# Patient Record
Sex: Male | Born: 1950 | Race: White | Hispanic: No | Marital: Married | Smoking: Former smoker
Health system: Southern US, Community
[De-identification: ages and names within clinical notes are randomized; demographics above are authoritative.]

## PROBLEM LIST (undated history)

## (undated) DIAGNOSIS — I10 Essential (primary) hypertension: Secondary | ICD-10-CM

## (undated) DIAGNOSIS — E213 Hyperparathyroidism, unspecified: Secondary | ICD-10-CM

## (undated) DIAGNOSIS — G709 Myoneural disorder, unspecified: Secondary | ICD-10-CM

## (undated) DIAGNOSIS — E785 Hyperlipidemia, unspecified: Secondary | ICD-10-CM

## (undated) HISTORY — PX: PARATHYROIDECTOMY: SHX19

## (undated) HISTORY — PX: INGUINAL HERNIA REPAIR: SUR1180

---

## 2010-12-24 ENCOUNTER — Ambulatory Visit: Payer: Self-pay | Admitting: Gastroenterology

## 2010-12-28 LAB — PATHOLOGY REPORT

## 2012-11-27 ENCOUNTER — Ambulatory Visit: Payer: Self-pay | Admitting: Surgery

## 2012-11-27 LAB — BASIC METABOLIC PANEL
Anion Gap: 4 — ABNORMAL LOW (ref 7–16)
BUN: 17 mg/dL (ref 7–18)
Creatinine: 1.02 mg/dL (ref 0.60–1.30)
EGFR (African American): >60
EGFR (Non-African Amer.): >60
Glucose: 92 mg/dL (ref 65–99)
Osmolality: 277 (ref 275–301)

## 2012-11-27 LAB — CBC
MCV: 88 fL (ref 80–100)
Platelet: 322 10*3/uL (ref 150–440)
RBC: 5.59 10*6/uL (ref 4.40–5.90)
WBC: 9.1 10*3/uL (ref 3.8–10.6)

## 2012-12-03 ENCOUNTER — Ambulatory Visit: Payer: Self-pay | Admitting: Surgery

## 2014-01-07 ENCOUNTER — Ambulatory Visit: Payer: Self-pay | Admitting: Physician Assistant

## 2014-01-07 LAB — CBC WITH DIFFERENTIAL/PLATELET
BASOS ABS: 0 10*3/uL (ref 0.0–0.1)
BASOS PCT: 0.3 %
Eosinophil #: 0 10*3/uL (ref 0.0–0.7)
Eosinophil %: 0.2 %
HCT: 53.3 % — ABNORMAL HIGH (ref 40.0–52.0)
HGB: 17.6 g/dL (ref 13.0–18.0)
LYMPHS ABS: 0.8 10*3/uL — AB (ref 1.0–3.6)
LYMPHS PCT: 6.7 %
MCH: 29.2 pg (ref 26.0–34.0)
MCHC: 33 g/dL (ref 32.0–36.0)
MCV: 89 fL (ref 80–100)
MONO ABS: 0.5 x10 3/mm (ref 0.2–1.0)
Monocyte %: 3.6 %
Neutrophil #: 11.3 10*3/uL — ABNORMAL HIGH (ref 1.4–6.5)
Neutrophil %: 89.2 %
PLATELETS: 301 10*3/uL (ref 150–440)
RBC: 6.02 10*6/uL — ABNORMAL HIGH (ref 4.40–5.90)
RDW: 13.8 % (ref 11.5–14.5)
WBC: 12.7 10*3/uL — AB (ref 3.8–10.6)

## 2014-01-07 LAB — COMPREHENSIVE METABOLIC PANEL
ALK PHOS: 70 U/L
ANION GAP: 10 (ref 7–16)
Albumin: 4.3 g/dL (ref 3.4–5.0)
BILIRUBIN TOTAL: 1.9 mg/dL — AB (ref 0.2–1.0)
BUN: 15 mg/dL (ref 7–18)
CHLORIDE: 105 mmol/L (ref 98–107)
Calcium, Total: 10 mg/dL (ref 8.5–10.1)
Co2: 29 mmol/L (ref 21–32)
Creatinine: 1.55 mg/dL — ABNORMAL HIGH (ref 0.60–1.30)
EGFR (African American): 54 — ABNORMAL LOW
EGFR (Non-African Amer.): 47 — ABNORMAL LOW
Glucose: 123 mg/dL — ABNORMAL HIGH (ref 65–99)
Osmolality: 289 (ref 275–301)
POTASSIUM: 4.2 mmol/L (ref 3.5–5.1)
SGOT(AST): 26 U/L (ref 15–37)
SGPT (ALT): 39 U/L (ref 12–78)
Sodium: 144 mmol/L (ref 136–145)
Total Protein: 8.2 g/dL (ref 6.4–8.2)

## 2014-01-09 ENCOUNTER — Ambulatory Visit: Payer: Self-pay

## 2014-01-09 LAB — CBC WITH DIFFERENTIAL/PLATELET
BASOS ABS: 0.1 10*3/uL (ref 0.0–0.1)
Basophil %: 0.7 %
EOS PCT: 3.1 %
Eosinophil #: 0.2 10*3/uL (ref 0.0–0.7)
HCT: 50.9 % (ref 40.0–52.0)
HGB: 16.8 g/dL (ref 13.0–18.0)
LYMPHS ABS: 1.8 10*3/uL (ref 1.0–3.6)
Lymphocyte %: 23.8 %
MCH: 29.4 pg (ref 26.0–34.0)
MCHC: 33.1 g/dL (ref 32.0–36.0)
MCV: 89 fL (ref 80–100)
MONOS PCT: 8.9 %
Monocyte #: 0.7 x10 3/mm (ref 0.2–1.0)
NEUTROS PCT: 63.5 %
Neutrophil #: 4.7 10*3/uL (ref 1.4–6.5)
Platelet: 277 10*3/uL (ref 150–440)
RBC: 5.73 10*6/uL (ref 4.40–5.90)
RDW: 13.7 % (ref 11.5–14.5)
WBC: 7.4 10*3/uL (ref 3.8–10.6)

## 2015-03-31 NOTE — Op Note (Signed)
PATIENT NAME:  Reginald Wolf, Reginald Wolf MR#:  [id number] DATE OF BIRTH:  [birth year]  DATE OF PROCEDURE:  12/03/2012  PREOPERATIVE DIAGNOSIS:  Left inguinal hernia.   POSTOPERATIVE DIAGNOSIS:  Left inguinal hernia.   PROCEDURE:   Left inguinal hernia repair.  SURGEON:  Rochel Brome, M.D.   ANESTHESIA:  General.   INDICATIONS: This 64 year old male has a history of left groin pain and findings of a left inguinal hernia.  Repair was recommended for definitive treatment.   DESCRIPTION OF PROCEDURE:  The patient was placed on the operating table in the supine position under general anesthesia. The left groin was prepared with clippers and with ChloraPrep, draped in a sterile manner.   A transversely oriented left suprapubic incision was made, carried down through subcutaneous tissues. Several small bleeding points were cauterized. The Scarpa's fascia was incised. The external oblique aponeurosis was incised along the course of its fibers to open the external ring and expose the inguinal cord structures. The ilioinguinal nerve was seen coursing over it. The cremaster muscle and the cord structures were mobilized, demonstrating a direct inguinal hernia. There was a sac which was approximately 4 cm in length. There was no indirect component. The attenuated transversalis fascia was incised circumferentially and removed, also a small lipoma was removed with electrocautery. Next, the remaining sac was inverted. The fascial defect was repaired with a row of 0-Surgilon sutures beginning at the pubic tubercle, suturing the conjoined tendon to the shelving edge  of the inguinal ligament, incorporating transversalis fascia into the repair.  The closure of the fascial ring defect was carried up to the inferior epigastric vessels. The internal ring was normal size. [place] [employer] mesh was cut to create an oval shape of some 2 x 3 cm with a notch cut out for the cord structures. The mesh was placed along the floor of the inguinal  canal, sutured to the repair site with 0-Surgilon and also sutured medially to the fascia. The repair looked good. Hemostasis was intact. The inguinal cord and ilioinguinal nerve were replaced along the floor of the inguinal canal. Cut edges of the external oblique aponeurosis were closed with a running 4-0 Vicryl to recreate the external ring; 0.5% Sensorcaine with epinephrine was injected beneath the fascia superior and lateral to the repair site and subcutaneous tissues were infiltrated using a total of 10 mL.  Next, the Scarpa's fascia was closed with interrupted 4-0 Vicryl. The skin was closed with running 4-0 Monocryl subcuticular suture and Dermabond. The patient tolerated the procedure satisfactorily, his testicle remained in the scrotum and the patient was prepared for transfer to the recovery room.   ____________________________ Lenna Sciara. Rochel Brome, MD jws:ct D: 12/03/2012 08:52:00 ET T: 12/03/2012 11:44:30 ET JOB#: [id number]  cc: Loreli Dollar, MD, <Dictator> Loreli Dollar MD ELECTRONICALLY SIGNED 12/04/2012 8:53

## 2015-05-03 ENCOUNTER — Encounter: Payer: Self-pay | Admitting: Emergency Medicine

## 2015-05-03 ENCOUNTER — Other Ambulatory Visit: Payer: Self-pay

## 2015-05-03 ENCOUNTER — Emergency Department
Admission: EM | Admit: 2015-05-03 | Discharge: 2015-05-03 | Disposition: A | Discharge status: Home / Self Care | Payer: 59 | Attending: Emergency Medicine | Admitting: Emergency Medicine

## 2015-05-03 ENCOUNTER — Emergency Department: Payer: 59

## 2015-05-03 ENCOUNTER — Ambulatory Visit (INDEPENDENT_AMBULATORY_CARE_PROVIDER_SITE_OTHER)
Admission: EM | Admit: 2015-05-03 | Discharge: 2015-05-03 | Disposition: A | Discharge status: Other Acute Inpatient Hospital | Payer: 59 | Source: Home / Self Care | Attending: Family Medicine | Admitting: Family Medicine

## 2015-05-03 DIAGNOSIS — Z7982 Long term (current) use of aspirin: Secondary | ICD-10-CM | POA: Insufficient documentation

## 2015-05-03 DIAGNOSIS — I10 Essential (primary) hypertension: Secondary | ICD-10-CM | POA: Insufficient documentation

## 2015-05-03 DIAGNOSIS — Z880 Allergy status to penicillin: Secondary | ICD-10-CM | POA: Insufficient documentation

## 2015-05-03 DIAGNOSIS — Z87891 Personal history of nicotine dependence: Secondary | ICD-10-CM | POA: Diagnosis not present

## 2015-05-03 DIAGNOSIS — R079 Chest pain, unspecified: Secondary | ICD-10-CM | POA: Insufficient documentation

## 2015-05-03 DIAGNOSIS — Z79899 Other long term (current) drug therapy: Secondary | ICD-10-CM | POA: Insufficient documentation

## 2015-05-03 DIAGNOSIS — K2970 Gastritis, unspecified, without bleeding: Secondary | ICD-10-CM | POA: Insufficient documentation

## 2015-05-03 DIAGNOSIS — R1013 Epigastric pain: Secondary | ICD-10-CM | POA: Diagnosis present

## 2015-05-03 DIAGNOSIS — R0789 Other chest pain: Secondary | ICD-10-CM | POA: Diagnosis not present

## 2015-05-03 HISTORY — DX: Essential (primary) hypertension: I10

## 2015-05-03 LAB — CBC
HCT: 49.2 % (ref 40.0–52.0)
HEMOGLOBIN: 16.1 g/dL (ref 13.0–18.0)
MCH: 28.5 pg (ref 26.0–34.0)
MCHC: 32.8 g/dL (ref 32.0–36.0)
MCV: 86.7 fL (ref 80.0–100.0)
Platelets: 284 10*3/uL (ref 150–440)
RBC: 5.67 MIL/uL (ref 4.40–5.90)
RDW: 13.7 % (ref 11.5–14.5)
WBC: 14.5 10*3/uL — AB (ref 3.8–10.6)

## 2015-05-03 LAB — BASIC METABOLIC PANEL
Anion gap: 8 (ref 5–15)
BUN: 22 mg/dL — ABNORMAL HIGH (ref 6–20)
CALCIUM: 9.6 mg/dL (ref 8.9–10.3)
CHLORIDE: 105 mmol/L (ref 101–111)
CO2: 25 mmol/L (ref 22–32)
Creatinine, Ser: 1.24 mg/dL (ref 0.61–1.24)
GFR calc Af Amer: >60 mL/min (ref >60)
GFR calc non Af Amer: 60 mL/min — ABNORMAL LOW (ref >60)
Glucose, Bld: 108 mg/dL — ABNORMAL HIGH (ref 65–99)
POTASSIUM: 3.8 mmol/L (ref 3.5–5.1)
SODIUM: 138 mmol/L (ref 135–145)

## 2015-05-03 LAB — LIPASE, BLOOD: Lipase: 40 U/L (ref 22–51)

## 2015-05-03 LAB — TROPONIN I

## 2015-05-03 MED ORDER — ASPIRIN 81 MG PO CHEW
324.0000 mg | CHEWABLE_TABLET | Freq: Once | ORAL | Status: AC
  Administered 2015-05-03: 324 mg via ORAL

## 2015-05-03 MED ORDER — OMEPRAZOLE 20 MG PO CPDR: 20.0000 mg | DELAYED_RELEASE_CAPSULE | Freq: Every day | ORAL | Status: DC

## 2015-05-03 NOTE — Discharge Instructions (Signed)
Gastritis, Adult °Gastritis is soreness and puffiness (inflammation) of the lining of the stomach. If you do not get help, gastritis can cause bleeding and sores (ulcers) in the stomach. °HOME CARE  °· Only take medicine as told by your doctor. °· If you were given antibiotic medicines, take them as told. Finish the medicines even if you start to feel better. °· Drink enough fluids to keep your pee (urine) clear or pale yellow. °· Avoid foods and drinks that make your problems worse. Foods you may want to avoid include: °¨ Caffeine or alcohol. °¨ Chocolate. °¨ Mint. °¨ Garlic and onions. °¨ Spicy foods. °¨ Citrus fruits, including oranges, lemons, or limes. °¨ Food containing tomatoes, including sauce, chili, salsa, and pizza. °¨ Fried and fatty foods. °· Eat small meals throughout the day instead of large meals. °GET HELP RIGHT AWAY IF:  °· You have black or dark red poop (stools). °· You throw up (vomit) blood. It may look like coffee grounds. °· You cannot keep fluids down. °· Your belly (abdominal) pain gets worse. °· You have a fever. °· You do not feel better after 1 week. °· You have any other questions or concerns. °MAKE SURE YOU:  °· Understand these instructions. °· Will watch your condition. °· Will get help right away if you are not doing well or get worse. °Document Released: 05/16/2008 Document Revised: 02/20/2012 Document Reviewed: 01/11/2012 °ExitCare® Patient Information ©2015 [employer]. This information is not intended to replace advice given to you by your health care provider. Make sure you discuss any questions you have with your health care provider. ° °

## 2015-05-03 NOTE — ED Notes (Signed)
Introduction made to patient. Pt resting in bed. Pt denies chest pain, abdominal pain, SOB.  Pt reports that he is feeling better. Wife is a bedside.  No needs at this time.

## 2015-05-03 NOTE — ED Notes (Addendum)
Pt from [facility] had complaints of abdominal pain under his ribs on his left side. The pain began around 2am. EMS was called and brought pt to [facility]

## 2015-05-03 NOTE — ED Provider Notes (Addendum)
Subjective:  Reginald Wolf is a 64 y.o. male who presents for evaluation of chest pain Onset was at 2am this morning. Patient was already awake.  The patient admits to chest discomfort that is lower sternal area extending both directions like a band and going to the back. Associated symptoms are feeling cold. Aggravating factors are none.  Alleviating factors are none. Patient's cardiac risk factors are hypertension. Previous cardiac testing includes stress test in 12/2014 which was normal done at [facility]. Pain level 3 out of 10 at this time. Denies SOB, diaphoresis, headache, fever.    PMHX:Hypertension not treated   Past Surgical History  Procedure Laterality Date  . Inguinal hernia repair    . Parathyroidectomy       (Not in a hospital admission) Allergies  Allergen Reactions  . Amoxicillin Nausea And Vomiting    History  Substance Use Topics  . Smoking status: Former Research scientist (life sciences)  . Smokeless tobacco: Not on file  . Alcohol Use: Yes     Comment: socially    Family History  Problem Relation Age of Onset  . Hypertension Mother     Review of Systems Pertinent items are noted in HPI.  Objective:  Patient Vitals for the past 8 hrs:  BP Temp Temp src Pulse Resp SpO2 Height Weight  05/03/15 0815 (!) 184/87 mmHg 99.2 F (37.3 C) Tympanic 67 16 97 % 5\' 4"  (1.626 m) 156 lb (70.761 kg)   BP 184/87 mmHg  Pulse 67  Temp(Src) 99.2 F (37.3 C) (Tympanic)  Resp 16  Ht 5\' 4"  (1.626 m)  Wt 156 lb (70.761 kg)  BMI 26.76 kg/m2  SpO2 97%  General Appearance:    Alert, cooperative, no distress, appears stated age  Head:    Normocephalic, without obvious abnormality, atraumatic              Neck:   Supple, symmetrical, trachea midline, no adenopathy;       thyroid:  No enlargement/tenderness/nodules; no carotid   bruit or JVD     Lungs:     Clear to auscultation bilaterally, respirations unlabored  Chest wall:    No tenderness  Heart:    Regular rate and rhythm, S1 and S2  normal, no murmur, rub   or gallop  Abdomen:     Soft, non-tender, bowel sounds active all four quadrants        Extremities:   Extremities normal, atraumatic, no cyanosis or edema  Pulses:   2+ and symmetric   Skin:   Skin color, texture, turgor normal, no rashes or lesions     Neurologic:   CNII-XII grossly intact    ECG: . NSR, no ST elevation or depressions noted on ECG, no previous to compare    Assessment:  Chest pain, unsure of etiology but with elevated BP in the office   Plan:  1. ECG Ordered 2. 2L O2 [place], ASA 325mg  given, IV Heplock 3. Nurse to call EMS for transport to ED 4. Charge nurse informed about patient 5.  6. Paulina Fusi, MD 7. 05/03/15 0840  Paulina Fusi, MD 05/03/15 [id number]

## 2015-05-03 NOTE — ED Notes (Signed)
Family at bedside. Patient request to go to [facility]. Per [facility], patient to be transported to [facility]

## 2015-05-03 NOTE — ED Notes (Signed)
Awake at 2am with bandlike chest pain. 3/10. Denies SOB, no N/V. Color pink, skin warm and dry. Continues to have 3/10 pain

## 2015-05-03 NOTE — ED Provider Notes (Signed)
[facility] Emergency Department Provider Note  ____________________________________________  Time seen: On arrival  I have reviewed the triage vital signs and the nursing notes.   HISTORY  Chief Complaint Abdominal Pain     HPI Reginald Wolf is a 64 y.o. male who presents with epigastric discomfort which he describes as initially moderate to severe but has now resolved completely. The pain started approximately 7 hours ago while he was sleeping. He described it as a burning pain. He denies shortness of breath. He went to the urgent care center this morning and they referred him to the emergency department given the proximity to his chest. He was given aspirin and nitroglycerin and the pain resolved. No cough no fevers no chills. He was admitted to [facility] and had a negative stress test in January of this year      Past Medical History  Diagnosis Date  . Hypertension     There are no active problems to display for this patient.   Past Surgical History  Procedure Laterality Date  . Inguinal hernia repair    . Parathyroidectomy      Current Outpatient Rx  Name  Route  Sig  Dispense  Refill  . aspirin 81 MG tablet   Oral   Take 81 mg by mouth daily.         . cholecalciferol (VITAMIN D) 1000 UNITS tablet   Oral   Take 1,000 Units by mouth daily.           Allergies Amoxicillin  Family History  Problem Relation Age of Onset  . Hypertension Mother     Social History History  Substance Use Topics  . Smoking status: Former Research scientist (life sciences)  . Smokeless tobacco: Not on file  . Alcohol Use: Yes     Comment: socially    Review of Systems  Constitutional: Negative for fever. Eyes: Negative for visual changes. ENT: Negative for sore throat. Cardiovascular: Negative for chest pain. Respiratory: Negative for shortness of breath. Gastrointestinal: Positive for abdominal pain Genitourinary: Negative for dysuria. Musculoskeletal: Negative for  back pain. Skin: Negative for rash. Neurological: Negative for headaches, focal weakness or numbness.   10-point ROS otherwise negative.  ____________________________________________   PHYSICAL EXAM:  VITAL SIGNS: ED Triage Vitals  Enc Vitals Group     BP 05/03/15 0916 154/106 mmHg     Pulse Rate 05/03/15 0916 73     Resp 05/03/15 0916 18     Temp 05/03/15 0916 98.4 F (36.9 C)     Temp Source 05/03/15 0916 Oral     SpO2 05/03/15 0908 96 %     Weight 05/03/15 0916 156 lb (70.761 kg)     Height 05/03/15 0916 5\' 4"  (1.626 m)     Head Cir --      Peak Flow --      Pain Score 05/03/15 0917 0     Pain Loc --      Pain Edu? --      Excl. in [place]? --      Constitutional: Alert and oriented. Well appearing and in no distress. Eyes: Conjunctivae are normal. PERRL. Normal extraocular movements. ENT   Head: Normocephalic and atraumatic.   Nose: No congestion/rhinnorhea.   Mouth/Throat: Mucous membranes are moist.   Neck: No stridor. Hematological/Lymphatic/Immunilogical: No cervical lymphadenopathy. Cardiovascular: Normal rate, regular rhythm. Normal and symmetric distal pulses are present in all extremities. No murmurs, rubs, or gallops. Respiratory: Normal respiratory effort without tachypnea nor retractions. Breath sounds  are clear and equal bilaterally. No wheezes/rales/rhonchi. Gastrointestinal: Soft and nontender. No distention. There is no CVA tenderness. Genitourinary: deferred Musculoskeletal: Nontender with normal range of motion in all extremities. No joint effusions.  No lower extremity tenderness nor edema. Neurologic:  Normal speech and language. No gross focal neurologic deficits are appreciated. Speech is normal.  Skin:  Skin is warm, dry and intact. No rash noted. Psychiatric: Mood and affect are normal. Speech and behavior are normal. Patient exhibits appropriate insight and judgment.  ____________________________________________    LABS  (pertinent positives/negatives)  Labs Reviewed  BASIC METABOLIC PANEL - Abnormal; Notable for the following:    Glucose, Bld 108 (*)    BUN 22 (*)    GFR calc non Af Amer 60 (*)    All other components within normal limits  CBC - Abnormal; Notable for the following:    WBC 14.5 (*)    All other components within normal limits  TROPONIN I  LIPASE, BLOOD     ____________________________________________   EKG Interpreted by me  Date: 05/03/2015  Rate: 73  Rhythm: normal sinus rhythm  QRS Axis: normal  Intervals: normal  ST/T Wave abnormalities: normal  Conduction Disutrbances: none  Narrative Interpretation: Possible old inferior infarct      ____________________________________________    RADIOLOGY  Chest x-ray normal, Bedside ultrasound shows cholelithiasis but no cholecystitis  ____________________________________________   PROCEDURES  Procedure(s) performed: None  Critical Care performed: None  ____________________________________________   INITIAL IMPRESSION / ASSESSMENT AND PLAN / ED COURSE  Pertinent labs & imaging results that were available during my care of the patient were reviewed by me and considered in my medical decision making (see chart for details).  Presentation concerning for gastritis versus biliary colic versus pancreatitis. We will check labs and I will do a bedside ultrasound to assess for cholecystitis.   ----------------------------------------- 12:34 PM on 05/03/2015 -----------------------------------------  Patient's labs are unremarkable and his chest x-ray is clear. His ultrasound does seem to suggest gallstones which may be the source of his discomfort as his troponin and his EKG are unremarkable. I recommended to the patient that he follow up with his PCP this week for further evaluation ____________________________________________   FINAL CLINICAL IMPRESSION(S) / ED DIAGNOSES  Final diagnoses:  Gastritis      Lavonia Drafts, MD 05/03/15 1246

## 2015-06-24 ENCOUNTER — Other Ambulatory Visit: Payer: Self-pay | Admitting: Gastroenterology

## 2015-06-24 DIAGNOSIS — R1011 Right upper quadrant pain: Secondary | ICD-10-CM

## 2015-06-24 DIAGNOSIS — R1013 Epigastric pain: Secondary | ICD-10-CM

## 2015-06-29 ENCOUNTER — Ambulatory Visit
Admission: RE | Admit: 2015-06-29 | Discharge: 2015-06-29 | Disposition: A | Discharge status: Home / Self Care | Payer: 59 | Source: Ambulatory Visit | Attending: Gastroenterology | Admitting: Gastroenterology

## 2015-06-29 DIAGNOSIS — R1011 Right upper quadrant pain: Secondary | ICD-10-CM

## 2015-06-29 DIAGNOSIS — K8020 Calculus of gallbladder without cholecystitis without obstruction: Secondary | ICD-10-CM | POA: Insufficient documentation

## 2015-06-29 DIAGNOSIS — R1013 Epigastric pain: Secondary | ICD-10-CM

## 2015-07-27 ENCOUNTER — Encounter: Payer: Self-pay | Admitting: *Deleted

## 2015-07-28 ENCOUNTER — Ambulatory Visit
Admission: RE | Admit: 2015-07-28 | Discharge: 2015-07-28 | Disposition: A | Discharge status: Home / Self Care | Payer: 59 | Source: Ambulatory Visit | Attending: Gastroenterology | Admitting: Gastroenterology

## 2015-07-28 ENCOUNTER — Encounter
Admission: RE | Disposition: A | Discharge status: Home / Self Care | Payer: Self-pay | Source: Ambulatory Visit | Attending: Gastroenterology

## 2015-07-28 ENCOUNTER — Ambulatory Visit: Payer: 59 | Admitting: Certified Registered Nurse Anesthetist

## 2015-07-28 DIAGNOSIS — Z7982 Long term (current) use of aspirin: Secondary | ICD-10-CM | POA: Diagnosis not present

## 2015-07-28 DIAGNOSIS — K621 Rectal polyp: Secondary | ICD-10-CM | POA: Diagnosis not present

## 2015-07-28 DIAGNOSIS — R1013 Epigastric pain: Secondary | ICD-10-CM | POA: Diagnosis not present

## 2015-07-28 DIAGNOSIS — R0789 Other chest pain: Secondary | ICD-10-CM | POA: Diagnosis not present

## 2015-07-28 DIAGNOSIS — Z79899 Other long term (current) drug therapy: Secondary | ICD-10-CM | POA: Diagnosis not present

## 2015-07-28 DIAGNOSIS — K449 Diaphragmatic hernia without obstruction or gangrene: Secondary | ICD-10-CM | POA: Diagnosis not present

## 2015-07-28 DIAGNOSIS — Z86010 Personal history of colonic polyps: Secondary | ICD-10-CM | POA: Diagnosis present

## 2015-07-28 DIAGNOSIS — Z87891 Personal history of nicotine dependence: Secondary | ICD-10-CM | POA: Diagnosis not present

## 2015-07-28 DIAGNOSIS — E785 Hyperlipidemia, unspecified: Secondary | ICD-10-CM | POA: Insufficient documentation

## 2015-07-28 DIAGNOSIS — I10 Essential (primary) hypertension: Secondary | ICD-10-CM | POA: Diagnosis not present

## 2015-07-28 DIAGNOSIS — E213 Hyperparathyroidism, unspecified: Secondary | ICD-10-CM | POA: Diagnosis not present

## 2015-07-28 DIAGNOSIS — Z881 Allergy status to other antibiotic agents status: Secondary | ICD-10-CM | POA: Diagnosis not present

## 2015-07-28 DIAGNOSIS — D125 Benign neoplasm of sigmoid colon: Secondary | ICD-10-CM | POA: Insufficient documentation

## 2015-07-28 DIAGNOSIS — K2900 Acute gastritis without bleeding: Secondary | ICD-10-CM | POA: Diagnosis not present

## 2015-07-28 DIAGNOSIS — K2210 Ulcer of esophagus without bleeding: Secondary | ICD-10-CM | POA: Insufficient documentation

## 2015-07-28 HISTORY — PX: COLONOSCOPY WITH PROPOFOL: SHX5780

## 2015-07-28 HISTORY — DX: Hyperlipidemia, unspecified: E78.5

## 2015-07-28 HISTORY — DX: Myoneural disorder, unspecified: G70.9

## 2015-07-28 HISTORY — DX: Hyperparathyroidism, unspecified: E21.3

## 2015-07-28 HISTORY — PX: ESOPHAGOGASTRODUODENOSCOPY (EGD) WITH PROPOFOL: SHX5813

## 2015-07-28 SURGERY — ESOPHAGOGASTRODUODENOSCOPY (EGD) WITH PROPOFOL
Anesthesia: General

## 2015-07-28 MED ORDER — SODIUM CHLORIDE 0.9 % IV SOLN
INTRAVENOUS | Status: DC
  Administered 2015-07-28 (×2): via INTRAVENOUS

## 2015-07-28 MED ORDER — GLYCOPYRROLATE 0.2 MG/ML IJ SOLN
INTRAMUSCULAR | Status: DC | PRN
  Administered 2015-07-28: 0.1 mg via INTRAVENOUS

## 2015-07-28 MED ORDER — SODIUM CHLORIDE 0.9 % IV SOLN: INTRAVENOUS | Status: DC

## 2015-07-28 MED ORDER — PROPOFOL INFUSION 10 MG/ML OPTIME
INTRAVENOUS | Status: DC | PRN
  Administered 2015-07-28: 150 ug/kg/min via INTRAVENOUS

## 2015-07-28 NOTE — Anesthesia Preprocedure Evaluation (Signed)
Anesthesia Evaluation  Patient identified by MRN, date of birth, ID band Patient awake    Reviewed: Allergy & Precautions, NPO status , Patient's Chart, lab work & pertinent test results  History of Anesthesia Complications Negative for: history of anesthetic complications  Airway Mallampati: II  TM Distance: >3 FB Neck ROM: Full    Dental  (+) Partial Lower   Pulmonary former smoker,          Cardiovascular hypertension (pt not on meds),     Neuro/Psych    GI/Hepatic GERD- (pt not on meds)  ,  Endo/Other    Renal/GU      Musculoskeletal   Abdominal   Peds  Hematology   Anesthesia Other Findings   Reproductive/Obstetrics                             Anesthesia Physical Anesthesia Plan  ASA: II  Anesthesia Plan: General   Post-op Pain Management:    Induction: Intravenous  Airway Management Planned:   Additional Equipment:   Intra-op Plan:   Post-operative Plan:   Informed Consent: I have reviewed the patients History and Physical, chart, labs and discussed the procedure including the risks, benefits and alternatives for the proposed anesthesia with the patient or authorized representative who has indicated his/her understanding and acceptance.     Plan Discussed with:   Anesthesia Plan Comments:         Anesthesia Quick Evaluation

## 2015-07-28 NOTE — H&P (Signed)
Outpatient short stay form Pre-procedure 07/28/2015 8:07 AM Lollie Sails MD  Primary Physician: Dr. Genene Churn  Reason for visit:  EGD and colonoscopy  History of present illness:  Patient is a 64 year old male setting for further evaluation of atypical chest pain and epigastric/right-sided abdominal pain. He also has a personal history of colon polyps. He did have a abdominal ultrasound done that showed stones and sludge in the gallbladder.    Current facility-administered medications:  .  0.9 %  sodium chloride infusion, , Intravenous, Continuous, Lollie Sails, MD, Last Rate: 20 mL/hr at 07/28/15 0756 .  0.9 %  sodium chloride infusion, , Intravenous, Continuous, Lollie Sails, MD  Prescriptions prior to admission  Medication Sig Dispense Refill Last Dose  . aspirin 81 MG tablet Take 81 mg by mouth daily.   Past Week at Unknown time  . cholecalciferol (VITAMIN D) 1000 UNITS tablet Take 1,000 Units by mouth daily.   Past Week at Unknown time  . omeprazole (PRILOSEC) 20 MG capsule Take 1 capsule (20 mg total) by mouth daily. (Patient not taking: Reported on 07/28/2015) 30 capsule 1 Not Taking at Unknown time     Allergies  Allergen Reactions  . Amoxicillin Nausea And Vomiting     Past Medical History  Diagnosis Date  . Hypertension   . Hyperlipidemia   . Hyperparathyroidism   . Neuromuscular disorder     Sciatica RT Side    Review of systems:      Physical Exam    Heart and lungs: Regular rate and rhythm without rub or gallop, lungs are bilaterally clear to auscultation    HEENT: Normocephalic atraumatic eyes are anicteric    Other:     Pertinant exam for procedure: Soft nontender nondistended bowel sounds positive and normoactive    Planned proceedures: EGD and colonoscopy I have discussed the risks benefits and complications of procedures to include not limited to bleeding, infection, perforation and the risk of sedation and the patient wishes to  proceed.    Lollie Sails, MD Gastroenterology 07/28/2015  8:07 AM

## 2015-07-28 NOTE — Op Note (Signed)
[facility] Patient Name: Tabius Rood Procedure Date: 07/28/2015 8:12 AM MRN: [id number] Account #: [id number] Date of Birth: Jan 13, 1951 Admit Type: Outpatient Age: 64 Room: [facility] ENDO ROOM 3 Gender: Male Note Status: Finalized Procedure:         Upper GI endoscopy Indications:       Epigastric abdominal pain, Unexplained chest pain Providers:         Lollie Sails, MD Referring MD:      Christena Flake. Raechel Ache, MD (Referring MD) Medicines:         Monitored Anesthesia Care Complications:     No immediate complications. Procedure:         Pre-Anesthesia Assessment:                    - ASA Grade Assessment: II - A patient with mild systemic                     disease.                    After obtaining informed consent, the endoscope was passed                     under direct vision. Throughout the procedure, the                     patient's blood pressure, pulse, and oxygen saturations                     were monitored continuously. The Endoscope was introduced                     through the mouth, and advanced to the third part of                     duodenum. The upper GI endoscopy was accomplished without                     difficulty. The patient tolerated the procedure well. Findings:      LA Grade C (one or more mucosal breaks continuous between tops of 2 or       more mucosal folds, less than 75% circumference) esophagitis with no       bleeding was found.      The exam of the esophagus was otherwise normal.      A small hiatus hernia was found. The Z-line was a variable distance from       incisors; the hiatal hernia was sliding.      Patchy mild inflammation characterized by erythema was found in the       gastric body and in the gastric antrum. Biopsies were taken with a cold       forceps for histology. Biopsies were taken with a cold forceps for       Helicobacter pylori testing.      Patchy moderate inflammation characterized by  erosions and erythema was       found in the cardia.      The cardia and gastric fundus were normal on retroflexion otherwise.      The examined duodenum was normal. Impression:        - LA Grade C erosive esophagitis.                    - Small hiatus hernia.                    -  Gastritis. Biopsied.                    - Erosive gastritis.                    - Normal examined duodenum. Recommendation:    - Use Protonix (pantoprazole) 40 mg PO BID for 1 month. Procedure Code(s): --- Professional ---                    [phone], Esophagogastroduodenoscopy, flexible, transoral;                     with biopsy, single or multiple Diagnosis Code(s): --- Professional ---                    530.19, Other esophagitis                    535.50, Unspecified gastritis and gastroduodenitis,                     without mention of hemorrhage                    535.40, Other specified gastritis, without mention of                     hemorrhage                    789.06, Abdominal pain, epigastric                    553.3, Diaphragmatic hernia without mention of obstruction                     or gangrene                    786.50, Chest pain, unspecified CPT copyright 2014 American Medical Association. All rights reserved. The codes documented in this report are preliminary and upon coder review may  be revised to meet current compliance requirements. Lollie Sails, MD 07/28/2015 8:27:21 AM This report has been signed electronically. Number of Addenda: 0 Note Initiated On: 07/28/2015 8:12 AM      [facility]

## 2015-07-28 NOTE — Anesthesia Postprocedure Evaluation (Signed)
  Anesthesia Post-op Note  Patient: Reginald Wolf  Procedure(s) Performed: Procedure(s): ESOPHAGOGASTRODUODENOSCOPY (EGD) WITH PROPOFOL (N/A) COLONOSCOPY WITH PROPOFOL (N/A)  Anesthesia type:General  Patient location: PACU  Post pain: Pain level controlled  Post assessment: Post-op Vital signs reviewed, Patient's Cardiovascular Status Stable, Respiratory Function Stable, Patent Airway and No signs of Nausea or vomiting  Post vital signs: Reviewed and stable  Last Vitals:  Filed Vitals:   07/28/15 0945  BP:   Pulse: 45  Resp: 13    Level of consciousness: awake, alert  and patient cooperative  Complications: No apparent anesthesia complications

## 2015-07-28 NOTE — Op Note (Signed)
[facility] Gastroenterology Patient Name: Reginald Wolf Procedure Date: 07/28/2015 8:11 AM MRN: [id number] Account #: [id number] Date of Birth: 04-27-1951 Admit Type: Outpatient Age: 64 Room: [facility] ENDO ROOM 3 Gender: Male Note Status: Finalized Procedure:         Colonoscopy Indications:       Personal history of colonic polyps Providers:         Lollie Sails, MD Referring MD:      Christena Flake. Raechel Ache, MD (Referring MD) Medicines:         Monitored Anesthesia Care Complications:     No immediate complications. Procedure:         Pre-Anesthesia Assessment:                    - ASA Grade Assessment: II - A patient with mild systemic                     disease.                    After obtaining informed consent, the colonoscope was                     passed under direct vision. Throughout the procedure, the                     patient's blood pressure, pulse, and oxygen saturations                     were monitored continuously. The Olympus PCF-H180AL                     colonoscope ( S#: [id number] ) was introduced through the                     anus and advanced to the the cecum, identified by                     appendiceal orifice and ileocecal valve. The quality of                     the bowel preparation was good. Findings:      Three sessile polyps were found in the distal sigmoid colon. The polyps       were less than 1 mm in size. These polyps were removed with a cold       biopsy forceps. Resection and retrieval were complete.      A 2 mm polyp was found in the rectum. The polyp was sessile. The polyp       was removed with a cold biopsy forceps. Resection and retrieval were       complete.      No additional abnormalities were found on retroflexion.      The digital rectal exam was normal. Impression:        - Three less than 1 mm polyps in the distal sigmoid colon.                     Resected and retrieved.                    - One 2 mm polyp in  the rectum. Resected and retrieved. Recommendation:    - Discharge patient to home. Procedure Code(s): --- Professional ---  45380, Colonoscopy, flexible; with biopsy, single or                     multiple Diagnosis Code(s): --- Professional ---                    211.3, Benign neoplasm of colon                    569.0, Anal and rectal polyp                    V12.72, Personal history of colonic polyps CPT copyright 2014 [employer]. All rights reserved. The codes documented in this report are preliminary and upon coder review may  be revised to meet current compliance requirements. Lollie Sails, MD 07/28/2015 8:48:47 AM This report has been signed electronically. Number of Addenda: 0 Note Initiated On: 07/28/2015 8:11 AM Scope Withdrawal Time: 0 hours 10 minutes 11 seconds  Total Procedure Duration: 0 hours 17 minutes 35 seconds       [facility] [place]

## 2015-07-28 NOTE — Transfer of Care (Signed)
Immediate Anesthesia Transfer of Care Note  Patient: Reginald Wolf  Procedure(s) Performed: Procedure(s): ESOPHAGOGASTRODUODENOSCOPY (EGD) WITH PROPOFOL (N/A) COLONOSCOPY WITH PROPOFOL (N/A)  Patient Location: PACU  Anesthesia Type:MAC and General  Level of Consciousness: awake  Airway & Oxygen Therapy: Patient Spontanous Breathing  Post-op Assessment: Report given to RN  Post vital signs: stable  Last Vitals: There were no vitals filed for this visit.  Complications: No apparent anesthesia complications

## 2015-07-29 ENCOUNTER — Encounter: Payer: Self-pay | Admitting: Gastroenterology

## 2015-07-29 LAB — SURGICAL PATHOLOGY

## 2016-05-19 IMAGING — US US ABDOMEN COMPLETE
1 series · 14 of 25 positions shown · non-contrast
Comparison: None.

CLINICAL DATA: Right upper quadrant, epigastric pain for 1 year.
Worsening over last 6 months.

EXAM:
ULTRASOUND ABDOMEN COMPLETE

[Series 1: us abdomen complete · 0.23mm/px · 14 of 92 slices shown]
[im 1/92]
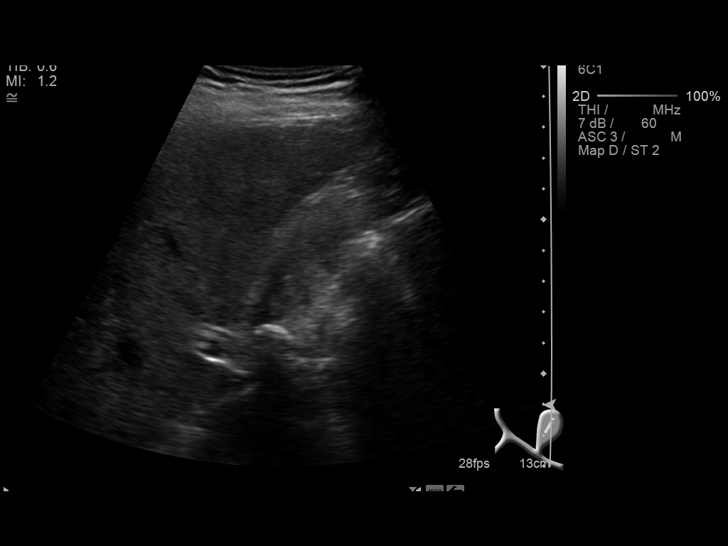
[im 8/92]
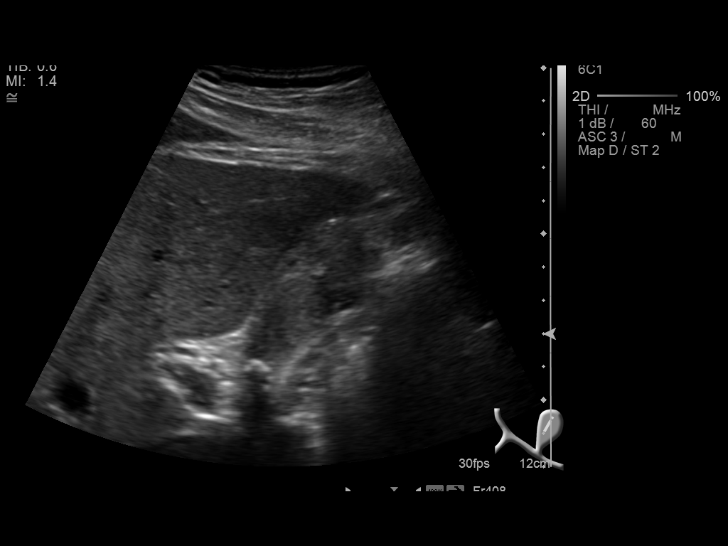
[im 16/92]
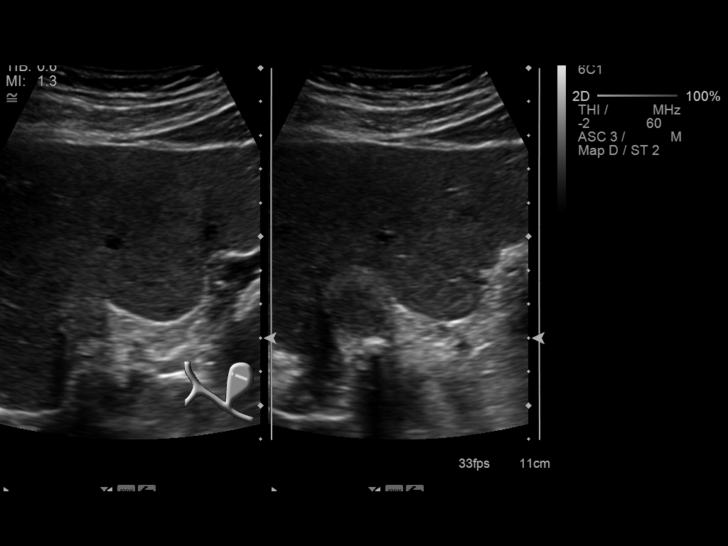
[im 23/92]
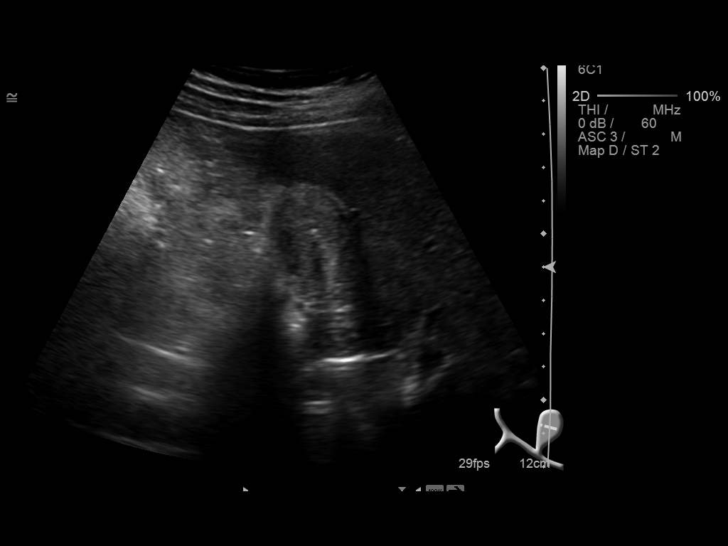
[im 31/92]
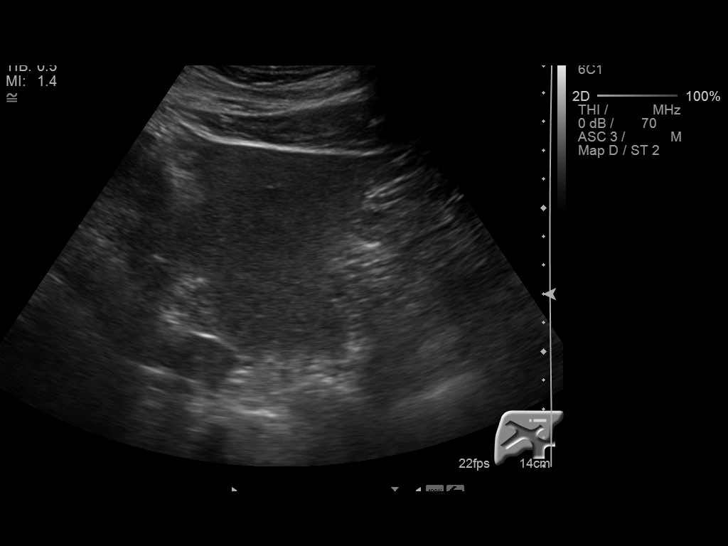
[im 35/92]
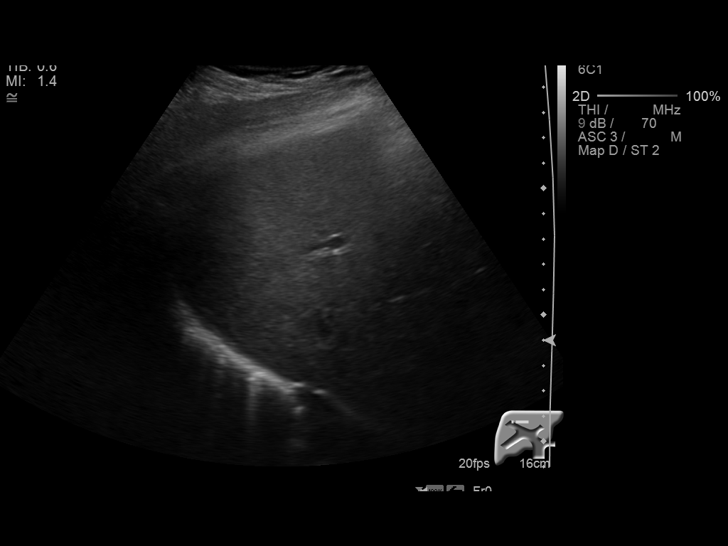
[im 42/92]
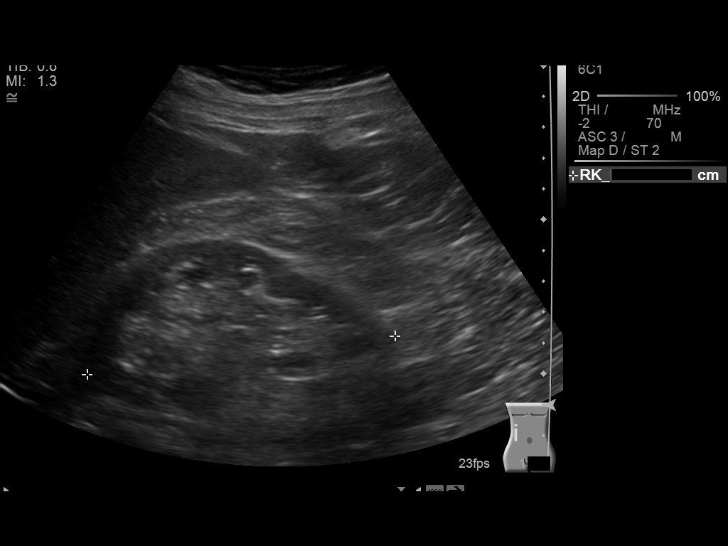
[im 50/92]
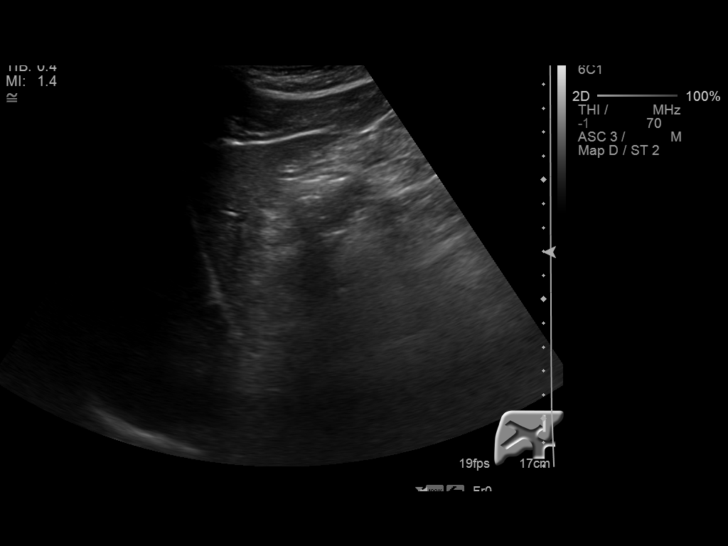
[im 57/92]
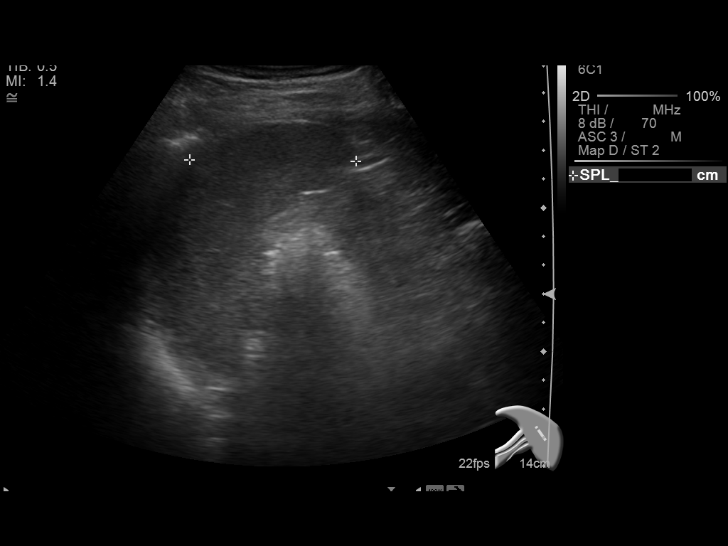
[im 61/92]
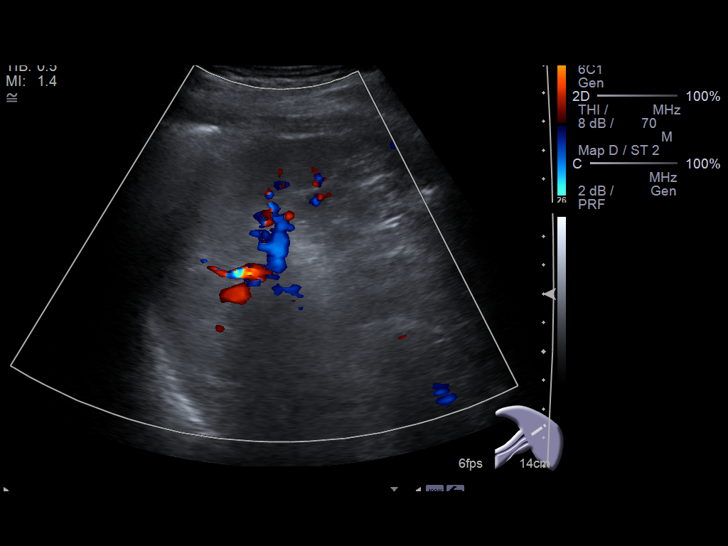
[im 69/92]
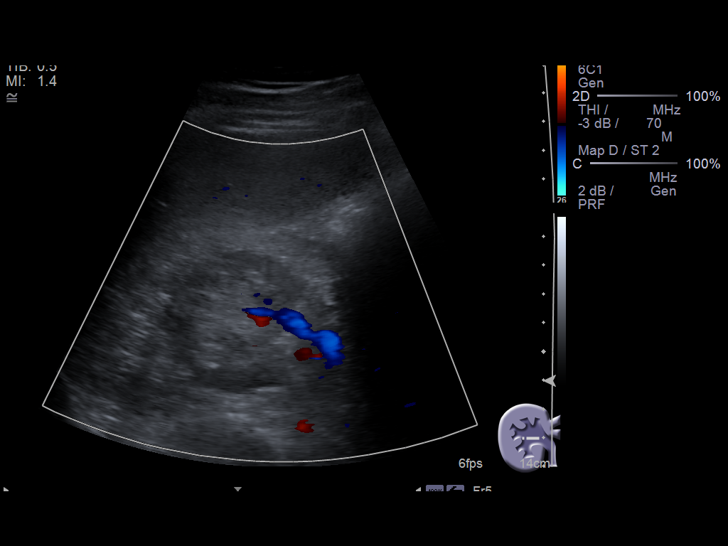
[im 76/92]
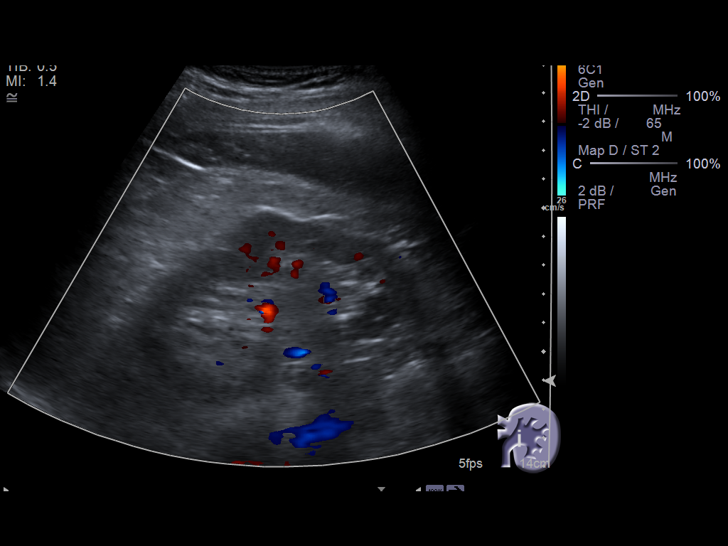
[im 84/92]
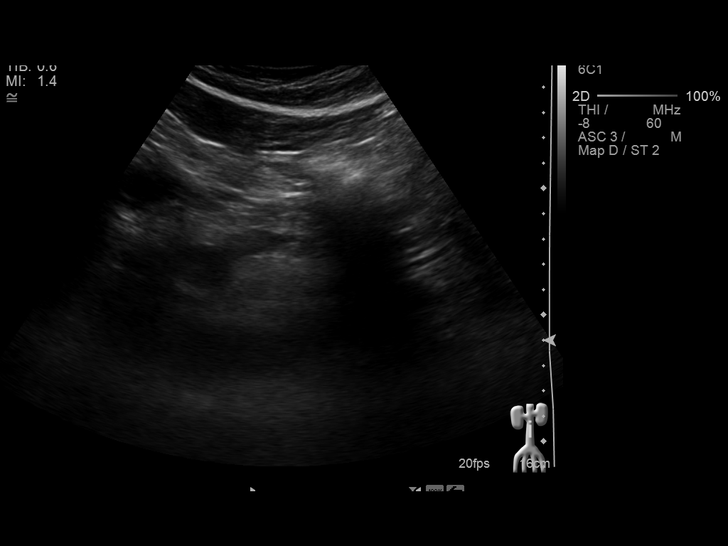
[im 92/92]
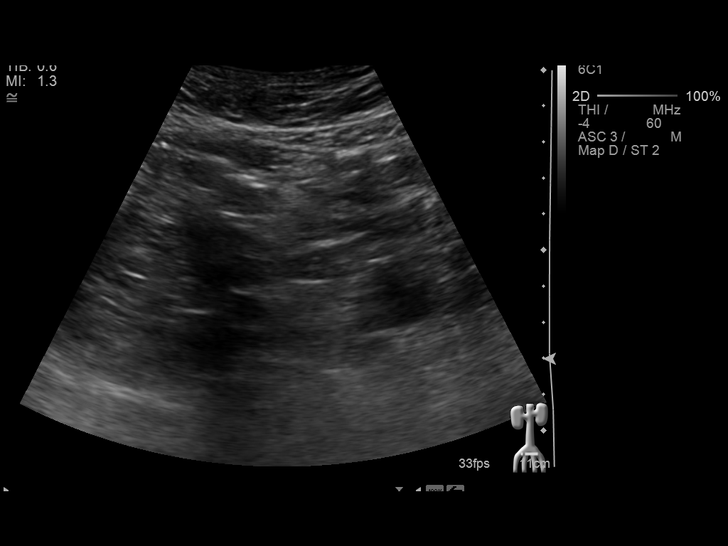

[14 of 25 positions shown; findings below may reference images not displayed]

FINDINGS: Gallbladder: Non mobile stones noted within the gallbladder neck,
the largest 1.4 cm. Sludge also present within the gallbladder.
Gallbladder wall is upper limits normal in thickness, 2.8 mm.
Negative sonographic Polin.

Common bile duct: Diameter: Normal caliber, 3 mm

Liver: No focal lesion identified. Within normal limits in
parenchymal echogenicity.

IVC: No abnormality visualized.

Pancreas: Visualized portion unremarkable.

Spleen: Size and appearance within normal limits.

Right Kidney: Length: 10.1 cm. Mild cortical thinning. Echogenicity
within normal limits. No mass or hydronephrosis visualized.

Left Kidney: Length: 9.1 cm. Mild cortical thinning. Echogenicity
within normal limits. No mass or hydronephrosis visualized.

Abdominal aorta: No aneurysm visualized.

Other findings: None.
IMPRESSION: Non mobile gallstones in the gallbladder neck with sludge in the
gallbladder. No sonographic evidence of acute cholecystitis at this
time.
